# Patient Record
Sex: Male | Born: 2000 | Race: Black or African American | Hispanic: No | Marital: Single | Smoking: Never smoker
Health system: Southern US, Community
[De-identification: ages and names within clinical notes are randomized; demographics above are authoritative.]

## PROBLEM LIST (undated history)

## (undated) DIAGNOSIS — H539 Unspecified visual disturbance: Secondary | ICD-10-CM

## (undated) DIAGNOSIS — M24232 Disorder of ligament, left wrist: Secondary | ICD-10-CM

## (undated) HISTORY — PX: NO PAST SURGERIES: SHX2092

## (undated) HISTORY — PX: WISDOM TOOTH EXTRACTION: SHX21

---
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
## 2020-04-22 ENCOUNTER — Other Ambulatory Visit: Payer: Self-pay

## 2020-04-23 ENCOUNTER — Ambulatory Visit (INDEPENDENT_AMBULATORY_CARE_PROVIDER_SITE_OTHER): Payer: BC Managed Care – PPO | Admitting: Family Medicine

## 2020-04-23 ENCOUNTER — Encounter: Payer: Self-pay | Admitting: Family Medicine

## 2020-04-23 VITALS — BP 115/68 | HR 58 | Temp 97.1°F | Ht 72.0 in | Wt 195.2 lb

## 2020-04-23 DIAGNOSIS — Z0000 Encounter for general adult medical examination without abnormal findings: Secondary | ICD-10-CM

## 2020-04-23 LAB — LIPID PANEL
Cholesterol: 163 mg/dL (ref 0–200)
HDL: 46.2 mg/dL (ref >39.00)
LDL Cholesterol: 111 mg/dL — ABNORMAL HIGH (ref 0–99)
NonHDL: 116.54
Total CHOL/HDL Ratio: 4
Triglycerides: 28 mg/dL (ref 0.0–149.0)
VLDL: 5.6 mg/dL (ref 0.0–40.0)

## 2020-04-23 LAB — URINALYSIS, ROUTINE W REFLEX MICROSCOPIC
Bilirubin Urine: NEGATIVE
Hgb urine dipstick: NEGATIVE
Ketones, ur: NEGATIVE
Leukocytes,Ua: NEGATIVE
Nitrite: NEGATIVE
Specific Gravity, Urine: 1.01 (ref 1.000–1.030)
Total Protein, Urine: NEGATIVE
Urine Glucose: NEGATIVE
Urobilinogen, UA: 0.2 (ref 0.0–1.0)
pH: 6.5 (ref 5.0–8.0)

## 2020-04-23 LAB — CBC
HCT: 41.5 % (ref 36.0–49.0)
Hemoglobin: 14.1 g/dL (ref 12.0–16.0)
MCHC: 34.1 g/dL (ref 31.0–37.0)
MCV: 89.9 fl (ref 78.0–98.0)
Platelets: 190 10*3/uL (ref 150.0–575.0)
RBC: 4.62 Mil/uL (ref 3.80–5.70)
RDW: 12.7 % (ref 11.4–15.5)
WBC: 3.1 10*3/uL — ABNORMAL LOW (ref 4.5–13.5)

## 2020-04-23 LAB — COMPREHENSIVE METABOLIC PANEL
ALT: 51 U/L (ref 0–53)
AST: 32 U/L (ref 0–37)
Albumin: 4.2 g/dL (ref 3.5–5.2)
Alkaline Phosphatase: 56 U/L (ref 52–171)
BUN: 11 mg/dL (ref 6–23)
CO2: 29 mEq/L (ref 19–32)
Calcium: 9.3 mg/dL (ref 8.4–10.5)
Chloride: 105 mEq/L (ref 96–112)
Creatinine, Ser: 1.23 mg/dL (ref 0.40–1.50)
GFR: 91.18 mL/min (ref >60.00)
Glucose, Bld: 87 mg/dL (ref 70–99)
Potassium: 4.1 mEq/L (ref 3.5–5.1)
Sodium: 139 mEq/L (ref 135–145)
Total Bilirubin: 0.6 mg/dL (ref 0.2–1.2)
Total Protein: 6.6 g/dL (ref 6.0–8.3)

## 2020-04-23 LAB — TSH: TSH: 1.03 u[IU]/mL (ref 0.40–5.00)

## 2020-04-23 NOTE — Progress Notes (Signed)
New Patient Office Visit  Subjective:  Patient ID: Richard Lin, male    DOB: 2001/02/27  Age: 19 y.o. MRN: [id number]  CC:  Chief Complaint  Patient presents with  . Establish Care    New patient, no concerns fasting for labs.     HPI Richard Lin presents for establishment of care and a physical exam.  He is healthy and well.  He takes no medicines chronically.  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  Status post Covid vaccination.  He plays baseball for the school.  School will be life this year but he tells me that they are required to wear masks.  Past Medical History:  Diagnosis Date  . Disorder of ligament of left wrist   . Vision abnormalities    wears contacts    Past Surgical History:  Procedure Laterality Date  . LIGAMENT REPAIR Left 05/16/2016   Procedure: LEFT WRIST LIGAMENT RECONSTRUCTION;  Surgeon: Mack Hook, MD;  Location: [facility];  Service: Orthopedics;  Laterality: Left;  PRE-OP BLOCK WITH GENERAL ANESTHESIA  . NO PAST SURGERIES    . WISDOM TOOTH EXTRACTION      Family History  Problem Relation Age of Onset  . Cancer Mother     Social History   Socioeconomic History  . Marital status: Single    Spouse name: Not on file  . Number of children: Not on file  . Years of education: Not on file  . Highest education level: Not on file  Occupational History  . Not on file  Tobacco Use  . Smoking status: Never Smoker  . Smokeless tobacco: Never Used  Vaping Use  . Vaping Use: Never used  Substance and Sexual Activity  . Alcohol use: No  . Drug use: No  . Sexual activity: Not Currently  Other Topics Concern  . Not on file  Social History Narrative   Lives with Mom.   Social Determinants of Health   Financial Resource Strain:   . Difficulty of Paying Living Expenses:   Food Insecurity:   . Worried About Programme researcher, broadcasting/film/video in the Last Year:   . Barista in the Last  Year:   Transportation Needs:   . Freight forwarder (Medical):   Marland Kitchen Lack of Transportation (Non-Medical):   Physical Activity:   . Days of Exercise per Week:   . Minutes of Exercise per Session:   Stress:   . Feeling of Stress :   Social Connections:   . Frequency of Communication with Friends and Family:   . Frequency of Social Gatherings with Friends and Family:   . Attends Religious Services:   . Active Member of Clubs or Organizations:   . Attends Banker Meetings:   Marland Kitchen Marital Status:   Intimate Partner Violence:   . Fear of Current or Ex-Partner:   . Emotionally Abused:   Marland Kitchen Physically Abused:   . Sexually Abused:     ROS Review of Systems  Constitutional: Negative.   HENT: Negative.   Eyes: Negative for photophobia and visual disturbance.  Respiratory: Negative.   Cardiovascular: Negative.   Gastrointestinal: Negative.   Endocrine: Negative for polyphagia and polyuria.  Genitourinary: Negative.   Musculoskeletal: Negative.   Skin: Negative for pallor and rash.  Allergic/Immunologic: Negative for immunocompromised state.  Neurological: Negative for light-headedness and numbness.  Hematological: Negative.   Psychiatric/Behavioral: Negative.     Objective:   Today's Vitals: BP 115/68  Pulse (!) 58   Temp (!) 97.1 F (36.2 C) (Tympanic)   Ht 6' (1.829 m)   Wt 195 lb 3.2 oz (88.5 kg)   SpO2 97%   BMI 26.47 kg/m   Physical Exam Constitutional:      General: He is not in acute distress.    Appearance: Normal appearance. He is normal weight. He is not ill-appearing, toxic-appearing or diaphoretic.  HENT:     Head: Normocephalic and atraumatic.     Right Ear: Tympanic membrane, ear canal and external ear normal.     Left Ear: Tympanic membrane, ear canal and external ear normal.     Nose: Nose normal.     Mouth/Throat:     Mouth: Mucous membranes are moist.     Pharynx: Oropharynx is clear. No oropharyngeal exudate or posterior  oropharyngeal erythema.  Eyes:     General: No scleral icterus.       Right eye: No discharge.        Left eye: No discharge.     Extraocular Movements: Extraocular movements intact.     Conjunctiva/sclera: Conjunctivae normal.     Pupils: Pupils are equal, round, and reactive to light.  Cardiovascular:     Rate and Rhythm: Normal rate and regular rhythm.  Pulmonary:     Effort: Pulmonary effort is normal.     Breath sounds: Normal breath sounds.  Abdominal:     General: Abdomen is flat. Bowel sounds are normal. There is no distension.     Palpations: Abdomen is soft. There is no mass.     Tenderness: There is no abdominal tenderness. There is no guarding or rebound.     Hernia: No hernia is present. There is no hernia in the left inguinal area or right inguinal area.  Genitourinary:    Penis: Circumcised. No hypospadias, erythema, tenderness, discharge, swelling or lesions.      Testes:        Right: Mass, tenderness or swelling not present. Right testis is descended.        Left: Mass, tenderness or swelling not present. Left testis is descended.     Epididymis:     Right: Not inflamed or enlarged.     Left: Not inflamed or enlarged.  Musculoskeletal:     Cervical back: Normal range of motion and neck supple. No rigidity or tenderness.     Right lower leg: No edema.     Left lower leg: No edema.  Lymphadenopathy:     Cervical: No cervical adenopathy.     Lower Body: No right inguinal adenopathy. No left inguinal adenopathy.  Skin:    General: Skin is warm and dry.  Neurological:     Mental Status: He is alert and oriented to person, place, and time.  Psychiatric:        Mood and Affect: Mood normal.        Behavior: Behavior normal.     Assessment & Plan:   Problem List Items Addressed This Visit    None    Visit Diagnoses    Healthcare maintenance    -  Primary   Relevant Orders   Comprehensive metabolic panel   CBC   HIV Antibody (routine testing w rflx)    Lipid panel   Urinalysis, Routine w reflex microscopic   TSH      Outpatient Encounter Medications as of 04/23/2020  Medication Sig  . HYDROcodone-acetaminophen (NORCO) 5-325 MG tablet Take 1 tablet by mouth every 6 (six)  hours as needed for moderate pain. (Patient not taking: Reported on 04/23/2020)   No facility-administered encounter medications on file as of 04/23/2020.    Follow-up: No follow-ups on file.   Patient was given information on health maintenance and disease prevention.  Mliss Sax, MD

## 2020-04-23 NOTE — Patient Instructions (Signed)
Health Maintenance, Male Adopting a healthy lifestyle and getting preventive care are important in promoting health and wellness. Ask your health care provider about:  The right schedule for you to have regular tests and exams.  Things you can do on your own to prevent diseases and keep yourself healthy. What should I know about diet, weight, and exercise? Eat a healthy diet   Eat a diet that includes plenty of vegetables, fruits, low-fat dairy products, and lean protein.  Do not eat a lot of foods that are high in solid fats, added sugars, or sodium. Maintain a healthy weight Body mass index (BMI) is a measurement that can be used to identify possible weight problems. It estimates body fat based on height and weight. Your health care provider can help determine your BMI and help you achieve or maintain a healthy weight. Get regular exercise Get regular exercise. This is one of the most important things you can do for your health. Most adults should:  Exercise for at least 150 minutes each week. The exercise should increase your heart rate and make you sweat (moderate-intensity exercise).  Do strengthening exercises at least twice a week. This is in addition to the moderate-intensity exercise.  Spend less time sitting. Even light physical activity can be beneficial. Watch cholesterol and blood lipids Have your blood tested for lipids and cholesterol at 20 years of age, then have this test every 5 years. You may need to have your cholesterol levels checked more often if:  Your lipid or cholesterol levels are high.  You are older than 19 years of age.  You are at high risk for heart disease. What should I know about cancer screening? Many types of cancers can be detected early and may often be prevented. Depending on your health history and family history, you may need to have cancer screening at various ages. This may include screening for:  Colorectal cancer.  Prostate  cancer.  Skin cancer.  Lung cancer. What should I know about heart disease, diabetes, and high blood pressure? Blood pressure and heart disease  High blood pressure causes heart disease and increases the risk of stroke. This is more likely to develop in people who have high blood pressure readings, are of African descent, or are overweight.  Talk with your health care provider about your target blood pressure readings.  Have your blood pressure checked: ? Every 3-5 years if you are 18-39 years of age. ? Every year if you are 40 years old or older.  If you are between the ages of 65 and 75 and are a current or former smoker, ask your health care provider if you should have a one-time screening for abdominal aortic aneurysm (AAA). Diabetes Have regular diabetes screenings. This checks your fasting blood sugar level. Have the screening done:  Once every three years after age 45 if you are at a normal weight and have a low risk for diabetes.  More often and at a younger age if you are overweight or have a high risk for diabetes. What should I know about preventing infection? Hepatitis B If you have a higher risk for hepatitis B, you should be screened for this virus. Talk with your health care provider to find out if you are at risk for hepatitis B infection. Hepatitis C Blood testing is recommended for:  Everyone born from 1945 through 1965.  Anyone with known risk factors for hepatitis C. Sexually transmitted infections (STIs)  You should be screened each year   for STIs, including gonorrhea and chlamydia, if: ? You are sexually active and are younger than 19 years of age. ? You are older than 19 years of age and your health care provider tells you that you are at risk for this type of infection. ? Your sexual activity has changed since you were last screened, and you are at increased risk for chlamydia or gonorrhea. Ask your health care provider if you are at risk.  Ask your  health care provider about whether you are at high risk for HIV. Your health care provider may recommend a prescription medicine to help prevent HIV infection. If you choose to take medicine to prevent HIV, you should first get tested for HIV. You should then be tested every 3 months for as long as you are taking the medicine. Follow these instructions at home: Lifestyle  Do not use any products that contain nicotine or tobacco, such as cigarettes, e-cigarettes, and chewing tobacco. If you need help quitting, ask your health care provider.  Do not use street drugs.  Do not share needles.  Ask your health care provider for help if you need support or information about quitting drugs. Alcohol use  Do not drink alcohol if your health care provider tells you not to drink.  If you drink alcohol: ? Limit how much you have to 0-2 drinks a day. ? Be aware of how much alcohol is in your drink. In the [place] one drink equals one 12 oz bottle of beer (355 mL), one 5 oz glass of wine (148 mL), or one 1 oz glass of hard liquor (44 mL). General instructions  Schedule regular health, dental, and eye exams.  Stay current with your vaccines.  Tell your health care provider if: ? You often feel depressed. ? You have ever been abused or do not feel safe at home. Summary  Adopting a healthy lifestyle and getting preventive care are important in promoting health and wellness.  Follow your health care provider's instructions about healthy diet, exercising, and getting tested or screened for diseases.  Follow your health care provider's instructions on monitoring your cholesterol and blood pressure. This information is not intended to replace advice given to you by your health care provider. Make sure you discuss any questions you have with your health care provider. Document Revised: 08/28/2018 Document Reviewed: 08/28/2018 Elsevier Patient Education  2020 [place] 18-21 Years  Old, Male Preventive care refers to lifestyle choices and visits with your health care provider that can promote health and wellness. At this stage in your life, you may start seeing a primary care physician instead of a pediatrician. Your health care is now your responsibility. Preventive care for young adults includes:  A yearly physical exam. This is also called an annual wellness visit.  Regular dental and eye exams.  Immunizations.  Screening for certain conditions.  Healthy lifestyle choices, such as diet and exercise. What can I expect for my preventive care visit? Physical exam Your health care provider may check:  Height and weight. These may be used to calculate body mass index (BMI), which is a measurement that tells if you are at a healthy weight.  Heart rate and blood pressure.  Body temperature. Counseling Your health care provider may ask you questions about:  Past medical problems and family medical history.  Alcohol, tobacco, and drug use.  Home and relationship well-being.  Access to firearms.  Emotional well-being.  Diet, exercise, and sleep habits.  Sexual  activity and sexual health. What immunizations do I need?  Influenza (flu) vaccine  This is recommended every year. Tetanus, diphtheria, and pertussis (Tdap) vaccine  You may need a Td booster every 10 years. Varicella (chickenpox) vaccine  You may need this vaccine if you have not already been vaccinated. Human papillomavirus (HPV) vaccine  If recommended by your health care provider, you may need three doses over 6 months. Measles, mumps, and rubella (MMR) vaccine  You may need at least one dose of MMR. You may also need a second dose. Meningococcal conjugate (MenACWY) vaccine  One dose is recommended if you are 49-45 years old and a Market researcher living in a residence hall, or if you have one of several medical conditions. You may also need additional booster  doses. Pneumococcal conjugate (PCV13) vaccine  You may need this if you have certain conditions and were not previously vaccinated. Pneumococcal polysaccharide (PPSV23) vaccine  You may need one or two doses if you smoke cigarettes or if you have certain conditions. Hepatitis A vaccine  You may need this if you have certain conditions or if you travel or work in places where you may be exposed to hepatitis A. Hepatitis B vaccine  You may need this if you have certain conditions or if you travel or work in places where you may be exposed to hepatitis B. Haemophilus influenzae type b (Hib) vaccine  You may need this if you have certain risk factors. You may receive vaccines as individual doses or as more than one vaccine together in one shot (combination vaccines). Talk with your health care provider about the risks and benefits of combination vaccines. What tests do I need? Blood tests  Lipid and cholesterol levels. These may be checked every 5 years starting at age 54.  Hepatitis C test.  Hepatitis B test. Screening  Genital exam to check for testicular cancer or hernias.  Sexually transmitted disease (STD) testing, if you are at risk. Other tests  Tuberculosis skin test.  Vision and hearing tests.  Skin exam. Follow these instructions at home: Eating and drinking   Eat a diet that includes fresh fruits and vegetables, whole grains, lean protein, and low-fat dairy products.  Drink enough fluid to keep your urine pale yellow.  Do not drink alcohol if: ? Your health care provider tells you not to drink. ? You are under the legal drinking age. In the [place] the legal drinking age is 69.  If you drink alcohol: ? Limit how much you have to 0-2 drinks a day. ? Be aware of how much alcohol is in your drink. In the [place] one drink equals one 12 oz bottle of beer (355 mL), one 5 oz glass of wine (148 mL), or one 1 oz glass of hard liquor (44 mL). Lifestyle  Take daily  care of your teeth and gums.  Stay active. Exercise at least 30 minutes 5 or more days of the week.  Do not use any products that contain nicotine or tobacco, such as cigarettes, e-cigarettes, and chewing tobacco. If you need help quitting, ask your health care provider.  Do not use drugs.  If you are sexually active, practice safe sex. Use a condom or other form of protection to prevent STIs (sexually transmitted infections).  Find healthy ways to cope with stress, such as: ? Meditation, yoga, or listening to music. ? Journaling. ? Talking to a trusted person. ? Spending time with friends and family. Safety  Always wear your  seat belt while driving or riding in a vehicle.  Do not drive if you have been drinking alcohol.  Do not ride with someone who has been drinking.  Do not drive when you are tired or distracted.  Do not text while driving.  Wear a helmet and other protective equipment during sports activities.  If you have firearms in your house, make sure you follow all gun safety procedures.  Seek help if you have been bullied, physically abused, or sexually abused.  Use the Internet responsibly to avoid dangers such as online bullying and online sex predators. What's next?  Go to your health care provider once a year for a well check visit.  Ask your health care provider how often you should have your eyes and teeth checked.  Stay up to date on all vaccines. This information is not intended to replace advice given to you by your health care provider. Make sure you discuss any questions you have with your health care provider. Document Revised: 08/29/2018 Document Reviewed: 08/29/2018 Elsevier Patient Education  2020 [employer].

## 2020-04-24 LAB — HIV ANTIBODY (ROUTINE TESTING W REFLEX): HIV 1&2 Ab, 4th Generation: NONREACTIVE

## 2022-04-18 ENCOUNTER — Encounter: Payer: Self-pay | Admitting: Family Medicine

## 2022-04-18 ENCOUNTER — Ambulatory Visit (INDEPENDENT_AMBULATORY_CARE_PROVIDER_SITE_OTHER): Payer: Medicaid Other | Admitting: Family Medicine

## 2022-04-18 VITALS — BP 118/78 | HR 68 | Temp 97.8°F | Ht 72.0 in | Wt 198.0 lb

## 2022-04-18 DIAGNOSIS — Z23 Encounter for immunization: Secondary | ICD-10-CM

## 2022-04-18 DIAGNOSIS — Z0000 Encounter for general adult medical examination without abnormal findings: Secondary | ICD-10-CM | POA: Diagnosis not present

## 2022-04-18 NOTE — Progress Notes (Signed)
Established Patient Office Visit  Subjective   Patient ID: Richard Lin, male    DOB: 2001-04-30  Age: 21 y.o. MRN: [id number]  Chief Complaint  Patient presents with   Annual Exam    CPE. No questions / concerns.     HPI for complete physical.  Doing well.  He is having no medical problems or concerns.  Basis.  Regular dental care and is active physically in college baseball.  He is a Producer, television/film/video and plays multiple positions.  Planning a career in Patent examiner.    Review of Systems  Constitutional: Negative.   HENT: Negative.    Eyes:  Negative for blurred vision, discharge and redness.  Respiratory: Negative.    Cardiovascular: Negative.   Gastrointestinal:  Negative for abdominal pain.  Genitourinary: Negative.   Musculoskeletal: Negative.  Negative for myalgias.  Skin:  Negative for rash.  Neurological:  Negative for tingling, loss of consciousness and weakness.  Endo/Heme/Allergies:  Negative for polydipsia.         04/18/2022    3:28 PM 04/23/2020   10:58 AM 04/23/2020   10:12 AM  Depression screen PHQ 2/9  Decreased Interest 0 0 0  Down, Depressed, Hopeless 0 0 0  PHQ - 2 Score 0 0 0  Altered sleeping  0   Tired, decreased energy  1   Change in appetite  0   Feeling bad or failure about yourself   0   Trouble concentrating  0   Moving slowly or fidgety/restless  0   Suicidal thoughts  0   PHQ-9 Score  1   Difficult doing work/chores  Not difficult at all      Objective:     BP 118/78 (BP Location: Right Arm, Patient Position: Sitting, Cuff Size: Normal)   Pulse 68   Temp 97.8 F (36.6 C) (Temporal)   Ht 6' (1.829 m)   Wt 198 lb (89.8 kg)   SpO2 96%   BMI 26.85 kg/m    Physical Exam Constitutional:      General: He is not in acute distress.    Appearance: Normal appearance. He is not ill-appearing, toxic-appearing or diaphoretic.  HENT:     Head: Normocephalic and atraumatic.     Right Ear: Tympanic membrane, ear canal and  external ear normal.     Left Ear: Tympanic membrane, ear canal and external ear normal.     Mouth/Throat:     Mouth: Mucous membranes are moist.     Pharynx: Oropharynx is clear. No oropharyngeal exudate or posterior oropharyngeal erythema.  Eyes:     General: No scleral icterus.       Right eye: No discharge.        Left eye: No discharge.     Extraocular Movements: Extraocular movements intact.     Conjunctiva/sclera: Conjunctivae normal.     Pupils: Pupils are equal, round, and reactive to light.  Cardiovascular:     Rate and Rhythm: Normal rate and regular rhythm.  Pulmonary:     Effort: Pulmonary effort is normal. No respiratory distress.     Breath sounds: Normal breath sounds.  Abdominal:     General: Bowel sounds are normal.     Tenderness: There is no abdominal tenderness. There is no guarding.  Musculoskeletal:     Cervical back: No rigidity or tenderness.  Lymphadenopathy:     Cervical: No cervical adenopathy.  Skin:    General: Skin is warm and dry.  Neurological:  Mental Status: He is alert and oriented to person, place, and time.  Psychiatric:        Mood and Affect: Mood normal.        Behavior: Behavior normal.      No results found for any visits on 04/18/22.    The ASCVD Risk score (Arnett DK, et al., 2019) failed to calculate for the following reasons:   The 2019 ASCVD risk score is only valid for ages 41 to 18    Assessment & Plan:   Problem List Items Addressed This Visit   None Visit Diagnoses     Healthcare maintenance    -  Primary   Relevant Orders   CBC   Comprehensive metabolic panel   Lipid panel   Urinalysis, Routine w reflex microscopic       No follow-ups on file.  Continue healthy lifestyle with physical activity and healthy eating.  Information was given maintenance and disease prevention.   Mliss Sax, MD

## 2022-04-19 ENCOUNTER — Telehealth: Payer: Self-pay | Admitting: Nurse Practitioner

## 2022-04-19 DIAGNOSIS — E875 Hyperkalemia: Secondary | ICD-10-CM

## 2022-04-19 LAB — LIPID PANEL
Cholesterol: 196 mg/dL (ref 0–200)
HDL: 56.6 mg/dL (ref >39.00)
LDL Cholesterol: 129 mg/dL — ABNORMAL HIGH (ref 0–99)
NonHDL: 139.15
Total CHOL/HDL Ratio: 3
Triglycerides: 53 mg/dL (ref 0.0–149.0)
VLDL: 10.6 mg/dL (ref 0.0–40.0)

## 2022-04-19 LAB — CBC
HCT: 46 % (ref 39.0–52.0)
Hemoglobin: 15.7 g/dL (ref 13.0–17.0)
MCHC: 34.1 g/dL (ref 30.0–36.0)
MCV: 90.4 fl (ref 78.0–100.0)
Platelets: 207 10*3/uL (ref 150.0–400.0)
RBC: 5.09 Mil/uL (ref 4.22–5.81)
RDW: 12.8 % (ref 11.5–15.5)
WBC: 3.4 10*3/uL — ABNORMAL LOW (ref 4.0–10.5)

## 2022-04-19 LAB — COMPREHENSIVE METABOLIC PANEL
ALT: 20 U/L (ref 0–53)
AST: 28 U/L (ref 0–37)
Albumin: 4.7 g/dL (ref 3.5–5.2)
Alkaline Phosphatase: 66 U/L (ref 39–117)
BUN: 15 mg/dL (ref 6–23)
CO2: 29 mEq/L (ref 19–32)
Calcium: 9.9 mg/dL (ref 8.4–10.5)
Chloride: 101 mEq/L (ref 96–112)
Creatinine, Ser: 1.34 mg/dL (ref 0.40–1.50)
GFR: 75.7 mL/min (ref >60.00)
Glucose, Bld: 54 mg/dL — ABNORMAL LOW (ref 70–99)
Potassium: 8.3 mEq/L (ref 3.5–5.1)
Sodium: 135 mEq/L (ref 135–145)
Total Bilirubin: 0.8 mg/dL (ref 0.2–1.2)
Total Protein: 7.7 g/dL (ref 6.0–8.3)

## 2022-04-19 LAB — URINALYSIS, ROUTINE W REFLEX MICROSCOPIC
Bilirubin Urine: NEGATIVE
Hgb urine dipstick: NEGATIVE
Ketones, ur: NEGATIVE
Leukocytes,Ua: NEGATIVE
Nitrite: NEGATIVE
RBC / HPF: NONE SEEN (ref 0–?)
Specific Gravity, Urine: <=1.005 — AB (ref 1.000–1.030)
Total Protein, Urine: NEGATIVE
Urine Glucose: NEGATIVE
Urobilinogen, UA: 0.2 (ref 0.0–1.0)
pH: 6.5 (ref 5.0–8.0)

## 2022-04-19 NOTE — Telephone Encounter (Signed)
Received a critical that his potassium is 8.3, however there is hemolysis seen. Note from dr. Doreene Burke reviewed and no concerns on exam. With hemolysis, will have him come in to repeat his potassium. Called and spoke with patient, he is currently out of town, but will come in either this afternoon or tomorrow to repeat this lab. He will call back to schedule an appointment.

## 2022-04-19 NOTE — Telephone Encounter (Signed)
Appointment scheduled for repeat labs.

## 2022-04-20 ENCOUNTER — Other Ambulatory Visit (INDEPENDENT_AMBULATORY_CARE_PROVIDER_SITE_OTHER): Payer: Medicaid Other

## 2022-04-20 DIAGNOSIS — Z0000 Encounter for general adult medical examination without abnormal findings: Secondary | ICD-10-CM | POA: Diagnosis not present

## 2022-04-20 LAB — POTASSIUM: Potassium: 4 mEq/L (ref 3.5–5.1)

## 2022-04-20 NOTE — Addendum Note (Signed)
Addended by: Andrez Grime on: 04/20/2022 08:03 AM   Modules accepted: Orders

## 2022-04-24 ENCOUNTER — Encounter: Payer: BC Managed Care – PPO | Admitting: Family Medicine

## 2023-04-27 ENCOUNTER — Telehealth: Payer: Self-pay | Admitting: Family Medicine

## 2023-04-27 NOTE — Telephone Encounter (Signed)
Patient has not been seen since 08.2023- left voicemail for patient to schedule an annual physical
